# Patient Record
Sex: Female | Born: 1980 | Hispanic: No | Marital: Married | Smoking: Former smoker
Health system: Southern US, Community
[De-identification: ages and names within clinical notes are randomized; demographics above are authoritative.]

---

## 2015-03-25 ENCOUNTER — Emergency Department (INDEPENDENT_AMBULATORY_CARE_PROVIDER_SITE_OTHER): Payer: BLUE CROSS/BLUE SHIELD

## 2015-03-25 ENCOUNTER — Encounter (HOSPITAL_COMMUNITY): Payer: Self-pay | Admitting: *Deleted

## 2015-03-25 ENCOUNTER — Emergency Department (HOSPITAL_COMMUNITY)
Admission: EM | Admit: 2015-03-25 | Discharge: 2015-03-25 | Disposition: A | Discharge status: Other Acute Inpatient Hospital | Payer: BLUE CROSS/BLUE SHIELD | Source: Home / Self Care | Attending: Family Medicine | Admitting: Family Medicine

## 2015-03-25 ENCOUNTER — Emergency Department (HOSPITAL_COMMUNITY)
Admission: EM | Admit: 2015-03-25 | Discharge: 2015-03-25 | Discharge status: Against Medical Advice | Payer: BLUE CROSS/BLUE SHIELD | Attending: Emergency Medicine | Admitting: Emergency Medicine

## 2015-03-25 ENCOUNTER — Encounter (HOSPITAL_COMMUNITY): Payer: Self-pay | Admitting: Emergency Medicine

## 2015-03-25 DIAGNOSIS — R509 Fever, unspecified: Secondary | ICD-10-CM | POA: Insufficient documentation

## 2015-03-25 LAB — COMPREHENSIVE METABOLIC PANEL
ALK PHOS: 61 U/L (ref 38–126)
ALT: 34 U/L (ref 14–54)
AST: 27 U/L (ref 15–41)
Albumin: 3.3 g/dL — ABNORMAL LOW (ref 3.5–5.0)
Anion gap: 9 (ref 5–15)
BUN: 10 mg/dL (ref 6–20)
CALCIUM: 8.4 mg/dL — AB (ref 8.9–10.3)
CO2: 23 mmol/L (ref 22–32)
CREATININE: 0.98 mg/dL (ref 0.44–1.00)
Chloride: 98 mmol/L — ABNORMAL LOW (ref 101–111)
Glucose, Bld: 125 mg/dL — ABNORMAL HIGH (ref 65–99)
Potassium: 3.6 mmol/L (ref 3.5–5.1)
Sodium: 130 mmol/L — ABNORMAL LOW (ref 135–145)
Total Bilirubin: 0.7 mg/dL (ref 0.3–1.2)
Total Protein: 7.2 g/dL (ref 6.5–8.1)

## 2015-03-25 LAB — POCT URINALYSIS DIP (DEVICE)
BILIRUBIN URINE: NEGATIVE
Glucose, UA: NEGATIVE mg/dL
KETONES UR: NEGATIVE mg/dL
Nitrite: NEGATIVE
PH: 6 (ref 5.0–8.0)
Protein, ur: 100 mg/dL — AB
SPECIFIC GRAVITY, URINE: 1.01 (ref 1.005–1.030)
Urobilinogen, UA: 1 mg/dL (ref 0.0–1.0)

## 2015-03-25 LAB — CBC WITH DIFFERENTIAL/PLATELET
Basophils Absolute: 0 10*3/uL (ref 0.0–0.1)
Basophils Relative: 0 %
EOS PCT: 0 %
Eosinophils Absolute: 0 10*3/uL (ref 0.0–0.7)
HCT: 34 % — ABNORMAL LOW (ref 36.0–46.0)
HEMOGLOBIN: 11.2 g/dL — AB (ref 12.0–15.0)
LYMPHS ABS: 1.2 10*3/uL (ref 0.7–4.0)
LYMPHS PCT: 8 %
MCH: 25.8 pg — AB (ref 26.0–34.0)
MCHC: 32.9 g/dL (ref 30.0–36.0)
MCV: 78.3 fL (ref 78.0–100.0)
Monocytes Absolute: 2.4 10*3/uL — ABNORMAL HIGH (ref 0.1–1.0)
Monocytes Relative: 15 %
NEUTROS PCT: 77 %
Neutro Abs: 11.9 10*3/uL — ABNORMAL HIGH (ref 1.7–7.7)
Platelets: 182 10*3/uL (ref 150–400)
RBC: 4.34 MIL/uL (ref 3.87–5.11)
RDW: 15.6 % — ABNORMAL HIGH (ref 11.5–15.5)
WBC: 15.5 10*3/uL — AB (ref 4.0–10.5)

## 2015-03-25 MED ORDER — ACETAMINOPHEN 325 MG PO TABS
ORAL_TABLET | ORAL | Status: DC
Start: 2015-03-25 — End: 2015-03-26
  Filled 2015-03-25: qty 2

## 2015-03-25 MED ORDER — ACETAMINOPHEN 325 MG PO TABS
650.0000 mg | ORAL_TABLET | Freq: Once | ORAL | Status: AC
  Administered 2015-03-25: 650 mg via ORAL

## 2015-03-25 NOTE — ED Notes (Signed)
Pt's husband stated that she didn't want to wait any longer and just wanted to go home. Pt encouraged to stay but stated she wanted to go home and lay down. Pt asked to come back if she felt worse or symptoms got worse

## 2015-03-25 NOTE — ED Notes (Signed)
Pt  Masked

## 2015-03-25 NOTE — ED Notes (Addendum)
Fever    Reports    Symptoms      Since  Thursday     -  Pt  States       -  She reports            That       She  Has  Been taking         Motrin          She  States          She  Has  Been taking         Motrin         /         For  The   Symptoms      Pt  Reports  Some  Stiffness  Of  Her  Neck  As   Well

## 2015-03-25 NOTE — ED Provider Notes (Addendum)
CSN: [id number]     Arrival date & time 03/25/15  1545 History   First MD Initiated Contact with Patient 03/25/15 1714     Chief Complaint  Patient presents with  . Fever   (Consider location/radiation/quality/duration/timing/severity/associated sxs/prior Treatment) Patient is a 34 y.o. female presenting with fever. The history is provided by the patient.  Fever Max temp prior to arrival:  105 this am. Severity:  Moderate Onset quality:  Sudden Duration:  5 days Progression:  Unchanged Chronicity:  New Ineffective treatments:  Acetaminophen and ibuprofen Associated symptoms: chills and headaches   Associated symptoms: no cough, no diarrhea, no dysuria, no nausea, no rash, no sore throat and no vomiting   Risk factors: no sick contacts     History reviewed. No pertinent past medical history. History reviewed. No pertinent past surgical history. History reviewed. No pertinent family history. Social History  Substance Use Topics  . Smoking status: Former Games developer  . Smokeless tobacco: None  . Alcohol Use: Yes   OB History    No data available     Review of Systems  Constitutional: Positive for fever and chills.  HENT: Negative for sore throat.   Respiratory: Negative.  Negative for cough.   Cardiovascular: Negative.   Gastrointestinal: Negative.  Negative for nausea, vomiting and diarrhea.  Genitourinary: Negative.  Negative for dysuria.  Skin: Negative for rash.  Neurological: Positive for headaches.  All other systems reviewed and are negative.   Allergies  Review of patient's allergies indicates no known allergies.  Home Medications   Prior to Admission medications   Medication Sig Start Date End Date Taking? Authorizing Provider  Aspirin-Acetaminophen (GOODYS BODY PAIN PO) Take by mouth.   Yes Historical Provider, MD  Ibuprofen (MOTRIN PO) Take by mouth.   Yes Historical Provider, MD   Meds Ordered and Administered this Visit  Medications - No data to  display  BP 99/62 mmHg  Pulse 118  Temp(Src) 102.4 F (39.1 C) (Oral)  Resp 16  SpO2 100%  LMP 03/07/2015 (Exact Date) No data found.   Physical Exam  Constitutional: She is oriented to person, place, and time. She appears well-developed and well-nourished. She appears distressed.  HENT:  Right Ear: External ear normal.  Left Ear: External ear normal.  Mouth/Throat: Oropharynx is clear and moist.  Eyes: Pupils are equal, round, and reactive to light.  Neck: No rigidity. Brudzinski's sign noted. No Kernig's sign noted.  Cardiovascular: Normal heart sounds.   Pulmonary/Chest: Effort normal and breath sounds normal.  Abdominal: Soft. Bowel sounds are normal.  Neurological: She is alert and oriented to person, place, and time.  Skin: Skin is warm and dry.  Nursing note and vitals reviewed.   ED Course  Procedures (including critical care time)  Labs Review Labs Reviewed  POCT URINALYSIS DIP (DEVICE) - Abnormal; Notable for the following:    Hgb urine dipstick MODERATE (*)    Protein, ur 100 (*)    Leukocytes, UA SMALL (*)    All other components within normal limits    Imaging Review Dg Chest 2 View  03/25/2015   CLINICAL DATA:  Fever of unknown origin for 5 day  EXAM: CHEST  2 VIEW  COMPARISON:  None.  FINDINGS: Clear lungs. Normal heart size. No pneumothorax. No pleural effusion. Mild pectus excavatum.  IMPRESSION: No active cardiopulmonary disease.   Electronically Signed   By: Jolaine Click M.D.   On: 03/25/2015 17:42     Visual Acuity Review  Right Eye  Distance:   Left Eye Distance:   Bilateral Distance:    Right Eye Near:   Left Eye Near:    Bilateral Near:         MDM   1. Fever of undetermined origin    Sent for fever  for 5 days eval, assoc headache, poss flu v. viral meningitis.    Linna Hoff, MD 03/25/15 1610  Linna Hoff, MD 03/27/15 [phone]

## 2015-03-25 NOTE — ED Notes (Addendum)
Pt. reports fever onset Thursday , denies cough or congestion , no dysuria , denies nausea or vomitting / no diarrhea or rashes. Pt. was seen at urgent care this evening x-ray and urinalysis done .

## 2017-03-15 DIAGNOSIS — Z114 Encounter for screening for human immunodeficiency virus [HIV]: Secondary | ICD-10-CM | POA: Diagnosis not present

## 2017-03-15 DIAGNOSIS — Z13220 Encounter for screening for lipoid disorders: Secondary | ICD-10-CM | POA: Diagnosis not present

## 2017-03-15 DIAGNOSIS — Z1329 Encounter for screening for other suspected endocrine disorder: Secondary | ICD-10-CM | POA: Diagnosis not present

## 2017-03-15 DIAGNOSIS — Z0000 Encounter for general adult medical examination without abnormal findings: Secondary | ICD-10-CM | POA: Diagnosis not present

## 2017-04-05 DIAGNOSIS — Z01419 Encounter for gynecological examination (general) (routine) without abnormal findings: Secondary | ICD-10-CM | POA: Diagnosis not present

## 2017-04-05 DIAGNOSIS — Z118 Encounter for screening for other infectious and parasitic diseases: Secondary | ICD-10-CM | POA: Diagnosis not present

## 2017-04-05 DIAGNOSIS — Z6830 Body mass index (BMI) 30.0-30.9, adult: Secondary | ICD-10-CM | POA: Diagnosis not present

## 2017-04-05 DIAGNOSIS — E559 Vitamin D deficiency, unspecified: Secondary | ICD-10-CM | POA: Diagnosis not present

## 2017-04-05 DIAGNOSIS — N760 Acute vaginitis: Secondary | ICD-10-CM | POA: Diagnosis not present

## 2017-04-05 DIAGNOSIS — D649 Anemia, unspecified: Secondary | ICD-10-CM | POA: Diagnosis not present

## 2017-04-05 DIAGNOSIS — Z124 Encounter for screening for malignant neoplasm of cervix: Secondary | ICD-10-CM | POA: Diagnosis not present

## 2017-04-05 DIAGNOSIS — L298 Other pruritus: Secondary | ICD-10-CM | POA: Diagnosis not present

## 2017-04-05 DIAGNOSIS — Z0000 Encounter for general adult medical examination without abnormal findings: Secondary | ICD-10-CM | POA: Diagnosis not present

## 2018-03-28 ENCOUNTER — Other Ambulatory Visit: Payer: Self-pay | Admitting: Family Medicine

## 2018-03-28 ENCOUNTER — Ambulatory Visit
Admission: RE | Admit: 2018-03-28 | Discharge: 2018-03-28 | Disposition: A | Discharge status: Home / Self Care | Payer: BLUE CROSS/BLUE SHIELD | Source: Ambulatory Visit | Attending: Family Medicine | Admitting: Family Medicine

## 2018-03-28 DIAGNOSIS — R059 Cough, unspecified: Secondary | ICD-10-CM

## 2018-03-28 DIAGNOSIS — Z23 Encounter for immunization: Secondary | ICD-10-CM | POA: Diagnosis not present

## 2018-03-28 DIAGNOSIS — Z6829 Body mass index (BMI) 29.0-29.9, adult: Secondary | ICD-10-CM | POA: Diagnosis not present

## 2018-03-28 DIAGNOSIS — J309 Allergic rhinitis, unspecified: Secondary | ICD-10-CM | POA: Diagnosis not present

## 2018-03-28 DIAGNOSIS — J069 Acute upper respiratory infection, unspecified: Secondary | ICD-10-CM | POA: Diagnosis not present

## 2018-03-28 DIAGNOSIS — J9801 Acute bronchospasm: Secondary | ICD-10-CM | POA: Diagnosis not present

## 2018-03-28 DIAGNOSIS — R05 Cough: Secondary | ICD-10-CM

## 2018-04-11 DIAGNOSIS — J45990 Exercise induced bronchospasm: Secondary | ICD-10-CM | POA: Diagnosis not present

## 2018-04-11 DIAGNOSIS — J309 Allergic rhinitis, unspecified: Secondary | ICD-10-CM | POA: Diagnosis not present

## 2018-04-11 DIAGNOSIS — J45991 Cough variant asthma: Secondary | ICD-10-CM | POA: Diagnosis not present

## 2019-01-19 ENCOUNTER — Other Ambulatory Visit: Payer: Self-pay

## 2019-01-19 DIAGNOSIS — Z20822 Contact with and (suspected) exposure to covid-19: Secondary | ICD-10-CM

## 2019-01-21 LAB — NOVEL CORONAVIRUS, NAA: SARS-CoV-2, NAA: NOT DETECTED

## 2019-03-14 DIAGNOSIS — Z23 Encounter for immunization: Secondary | ICD-10-CM | POA: Diagnosis not present

## 2019-05-22 ENCOUNTER — Other Ambulatory Visit: Payer: Self-pay

## 2019-05-22 DIAGNOSIS — Z20822 Contact with and (suspected) exposure to covid-19: Secondary | ICD-10-CM

## 2019-05-23 LAB — NOVEL CORONAVIRUS, NAA: SARS-CoV-2, NAA: NOT DETECTED

## 2020-03-01 DIAGNOSIS — R946 Abnormal results of thyroid function studies: Secondary | ICD-10-CM | POA: Diagnosis not present

## 2020-03-01 DIAGNOSIS — D649 Anemia, unspecified: Secondary | ICD-10-CM | POA: Diagnosis not present

## 2020-03-01 DIAGNOSIS — R42 Dizziness and giddiness: Secondary | ICD-10-CM | POA: Diagnosis not present

## 2020-03-01 DIAGNOSIS — I959 Hypotension, unspecified: Secondary | ICD-10-CM | POA: Diagnosis not present

## 2020-03-04 DIAGNOSIS — E559 Vitamin D deficiency, unspecified: Secondary | ICD-10-CM | POA: Diagnosis not present

## 2020-03-04 DIAGNOSIS — R7989 Other specified abnormal findings of blood chemistry: Secondary | ICD-10-CM | POA: Diagnosis not present

## 2020-03-04 DIAGNOSIS — D649 Anemia, unspecified: Secondary | ICD-10-CM | POA: Diagnosis not present

## 2020-08-06 IMAGING — DX DG CHEST 2V
2 series · 2 of 2 positions shown · non-contrast
Comparison: March 25, 2015

CLINICAL DATA: Cough for 1 month

EXAM:
CHEST - 2 VIEW

[dg chest 2 view (1 of 2)]
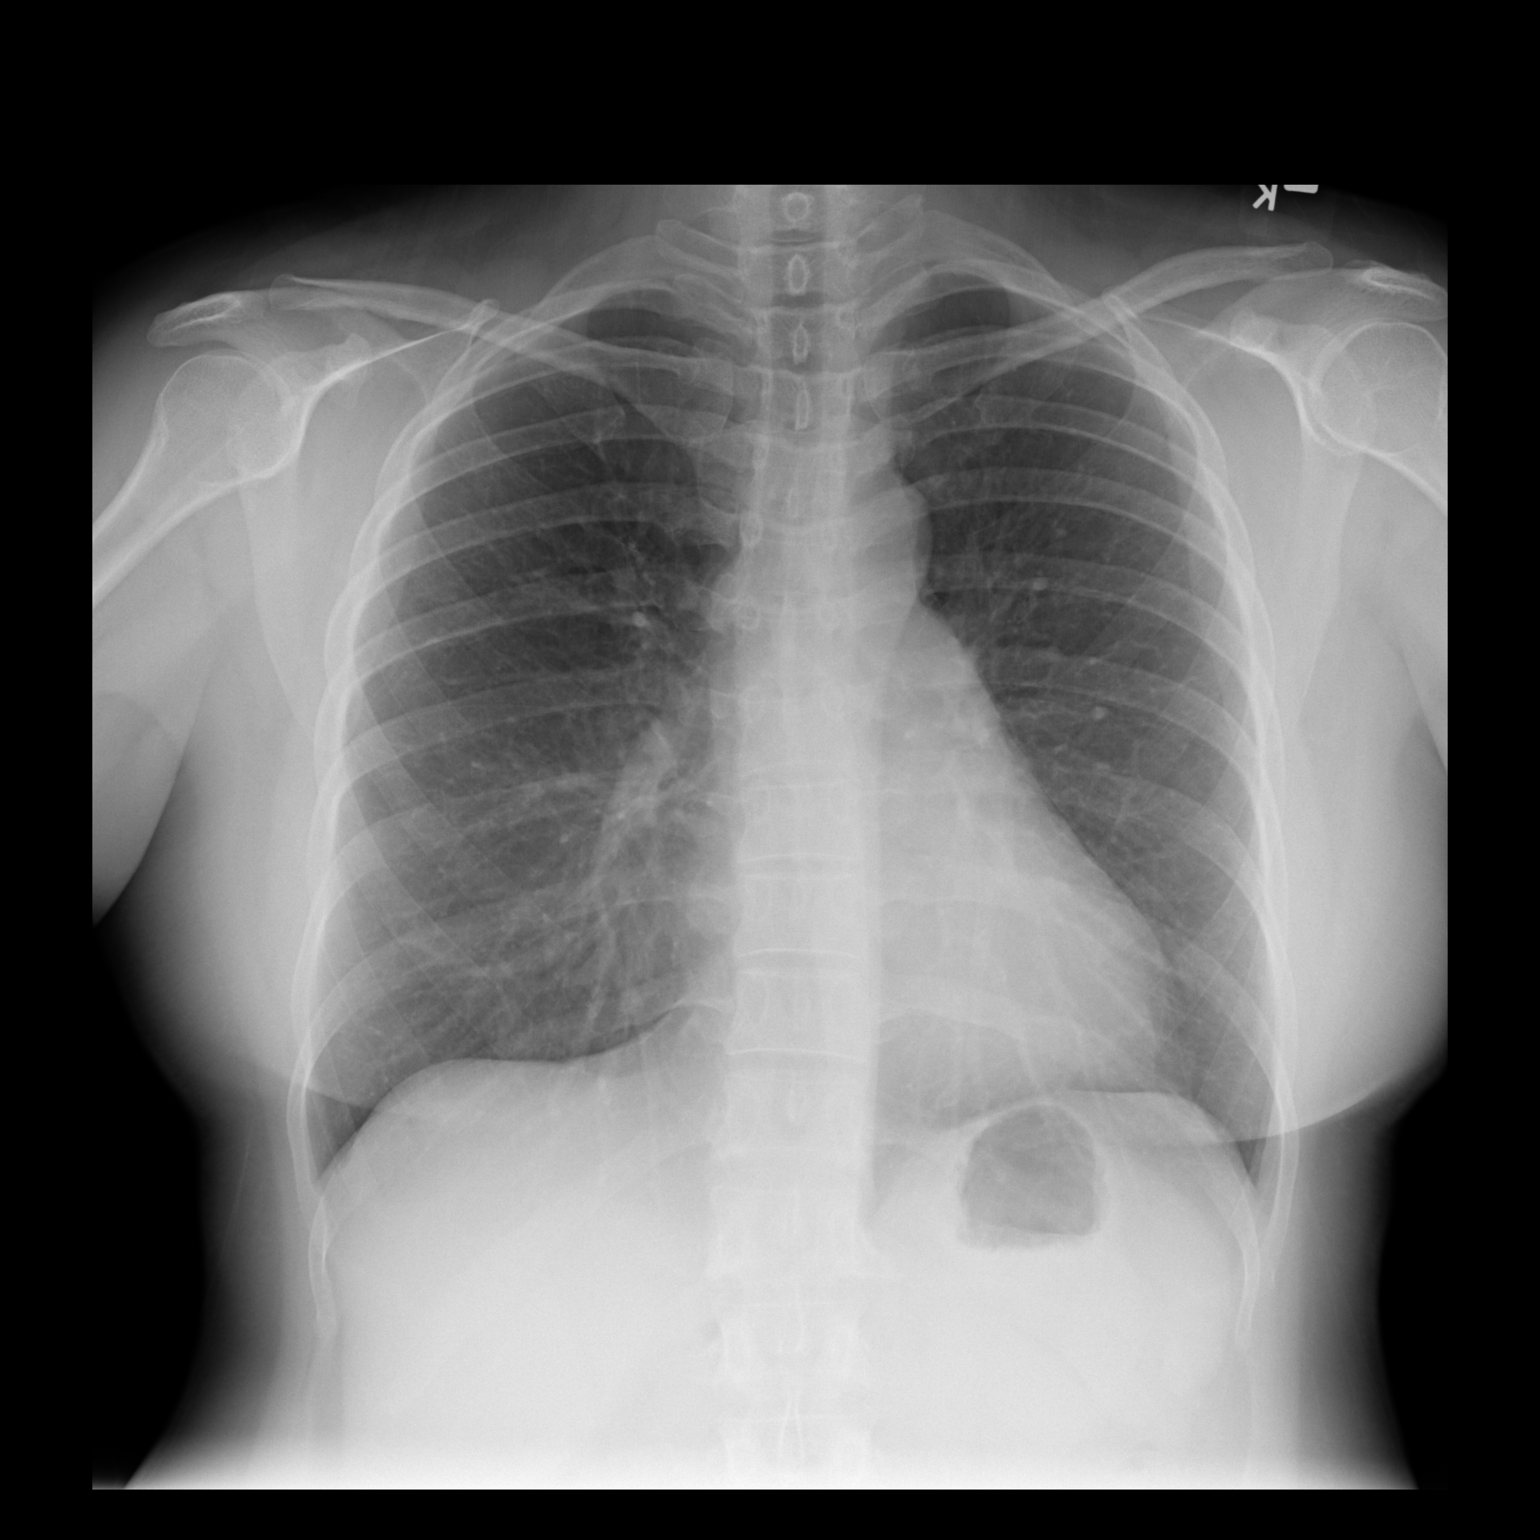

[dg chest 2 view (2 of 2)]
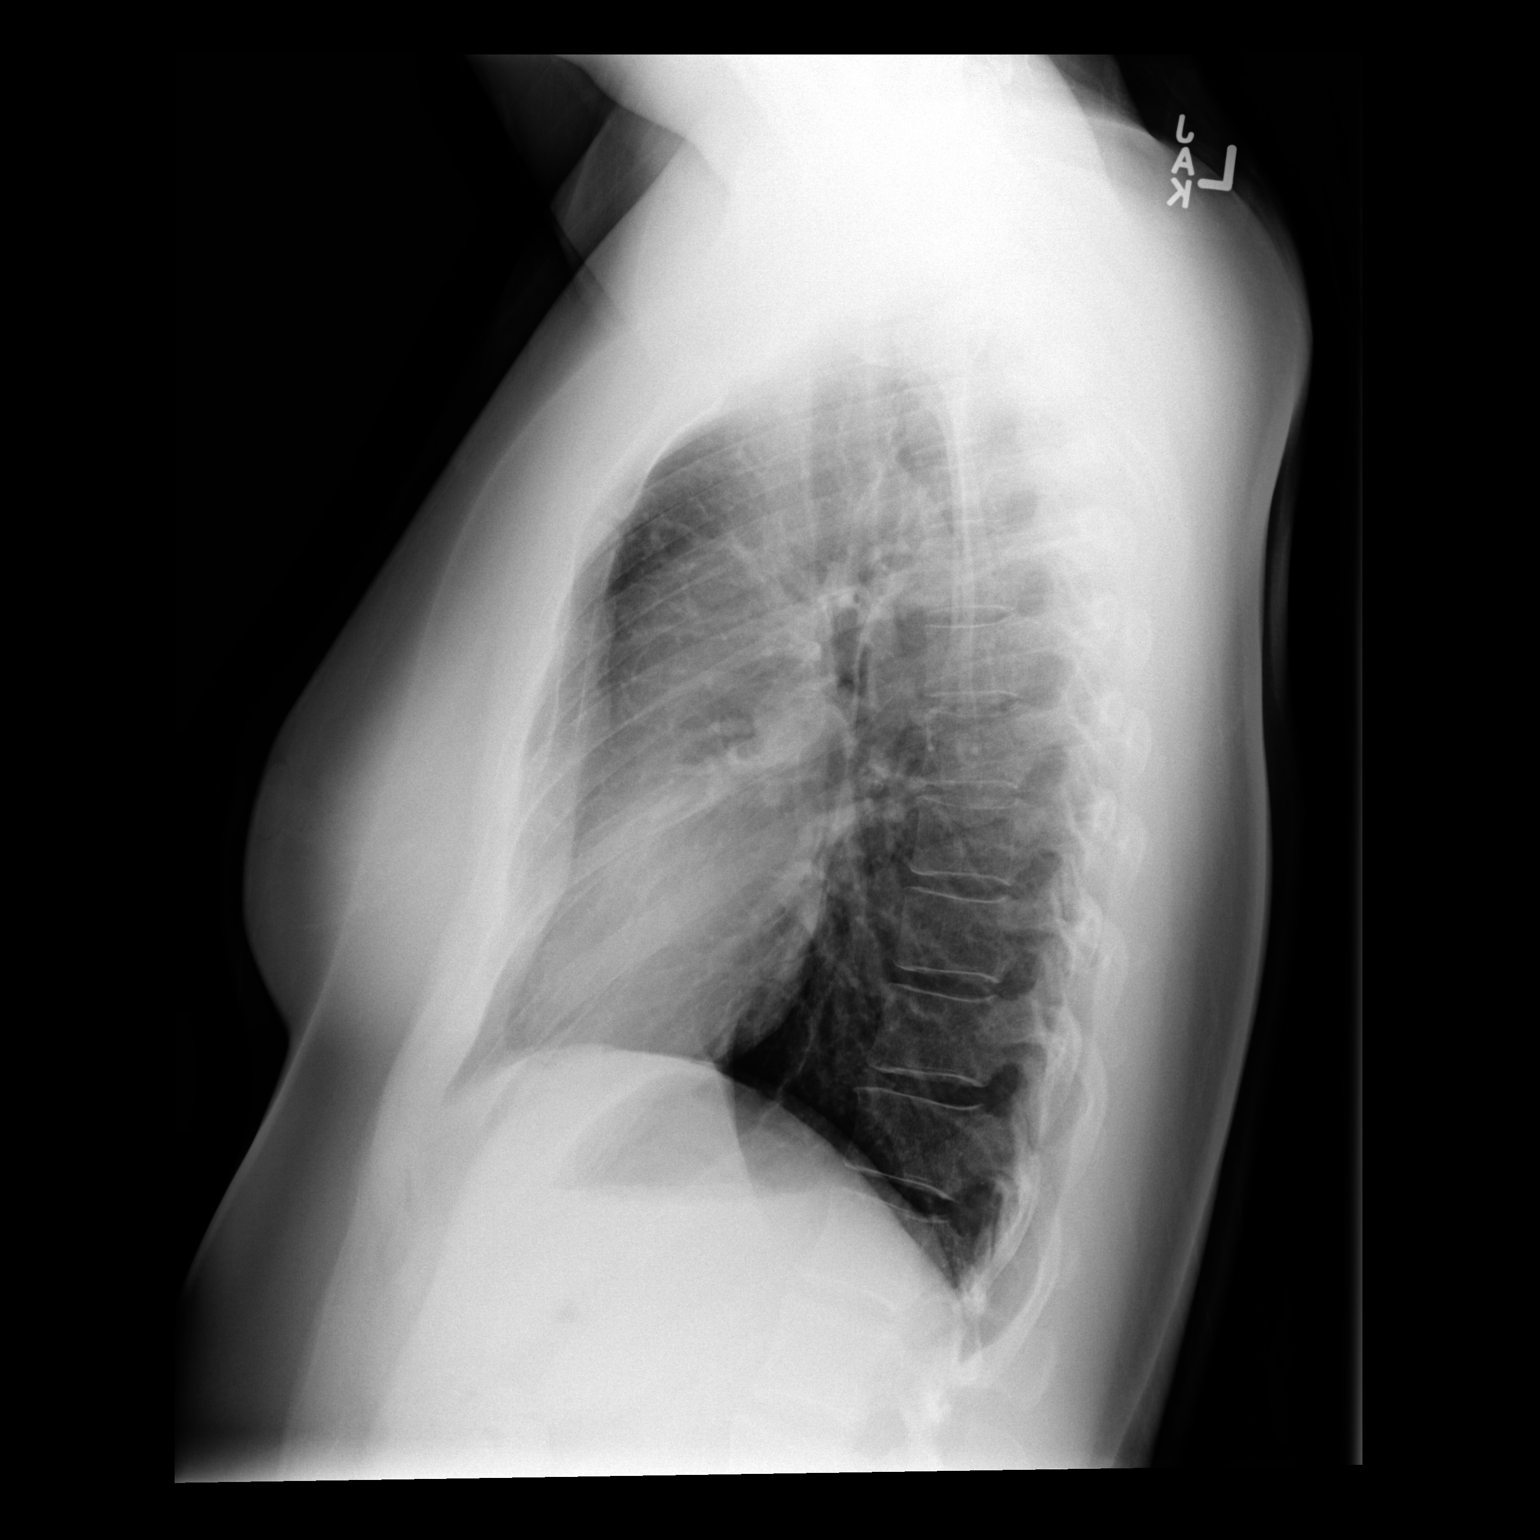

[2 of 2 positions shown; findings below may reference images not displayed]

FINDINGS: Lungs are clear. Heart size and pulmonary vascularity are normal. No
adenopathy. No bone lesions.
IMPRESSION: No edema or consolidation.
